# Patient Record
Sex: Female | Born: 2000 | Race: Black or African American | Hispanic: No | Marital: Single | Smoking: Never smoker
Health system: Southern US, Community
[De-identification: ages and names within clinical notes are randomized; demographics above are authoritative.]

---
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
## 2020-02-02 ENCOUNTER — Ambulatory Visit: Payer: 59 | Admitting: Nurse Practitioner

## 2020-02-02 ENCOUNTER — Other Ambulatory Visit: Payer: Self-pay

## 2020-02-02 ENCOUNTER — Encounter: Payer: Self-pay | Admitting: Nurse Practitioner

## 2020-02-02 VITALS — BP 110/68 | HR 64 | Temp 98.6°F | Ht 64.6 in | Wt 121.0 lb

## 2020-02-02 DIAGNOSIS — Z308 Encounter for other contraceptive management: Secondary | ICD-10-CM | POA: Diagnosis not present

## 2020-02-02 DIAGNOSIS — Z0000 Encounter for general adult medical examination without abnormal findings: Secondary | ICD-10-CM | POA: Diagnosis not present

## 2020-02-02 NOTE — Progress Notes (Signed)
This visit occurred during the SARS-CoV-2 public health emergency.  Safety protocols were in place, including screening questions prior to the visit, additional usage of staff PPE, and extensive cleaning of exam room while observing appropriate contact time as indicated for disinfecting solutions.  Subjective:     Patient ID: Teresa Elliott , female    DOB: 06/18/2001 , 18 y.o.   MRN: [id number]   Chief Complaint  Patient presents with  . Establish Care    HPI  Here to establish care - had been going to [facility]. She was referred by her mother Teresa Elliott.  Graduates from High School in June from [employer].  She would like to major in Sports Medicine. She cheers for the school.    One sister - healthy.  Mother - she is unsure of her health history.  Father - she is unsure if he has any health history  LMP - 01/04/2020. She is no longer taking birth control pills months. Denies being sexually active. She was unable to keep up with the schedule she is interested in either Depo or nexplanon.       History reviewed. No pertinent past medical history.   Family History  Problem Relation Age of Onset  . Healthy Mother   . Healthy Father   . Healthy Sister     No current outpatient medications on file.   No Known Allergies   Review of Systems  Constitutional: Negative.   HENT: Negative.   Eyes: Negative.   Respiratory: Negative.  Negative for cough and shortness of breath.   Cardiovascular: Negative.   Gastrointestinal: Negative.   Endocrine: Negative.   Genitourinary: Negative.   Musculoskeletal: Negative.   Skin: Negative.   Allergic/Immunologic: Negative.   Neurological: Negative.   Hematological: Negative.   Psychiatric/Behavioral: Negative.      Today's Vitals   02/02/20 1521  BP: 110/68  Pulse: 64  Temp: 98.6 F (37 C)  TempSrc: Oral  Weight: 121 lb (54.9 kg)  Height: 5' 4.6" (1.641 m)  PainSc: 0-No pain   Body mass index is 20.39 kg/m.    Objective:  Physical Exam Constitutional:      Appearance: Normal appearance. She is well-developed.  HENT:     Head: Normocephalic and atraumatic.     Right Ear: Hearing, tympanic membrane, ear canal and external ear normal. There is no impacted cerumen.     Left Ear: Hearing, tympanic membrane, ear canal and external ear normal. There is no impacted cerumen.     Nose: Nose normal. No congestion.     Mouth/Throat:     Mouth: Mucous membranes are moist.  Eyes:     General: Lids are normal.     Extraocular Movements: Extraocular movements intact.     Conjunctiva/sclera: Conjunctivae normal.     Pupils: Pupils are equal, round, and reactive to light.     Funduscopic exam:    Right eye: No papilledema.        Left eye: No papilledema.  Neck:     Thyroid: No thyroid mass.     Vascular: No carotid bruit.  Cardiovascular:     Rate and Rhythm: Normal rate and regular rhythm.     Pulses: Normal pulses.     Heart sounds: Normal heart sounds. No murmur.  Pulmonary:     Effort: Pulmonary effort is normal.     Breath sounds: Normal breath sounds.  Abdominal:     General: Abdomen is flat. Bowel sounds are normal.       Palpations: Abdomen is soft.  Musculoskeletal:        General: No swelling. Normal range of motion.     Cervical back: Full passive range of motion without pain, normal range of motion and neck supple.     Right lower leg: No edema.     Left lower leg: No edema.  Skin:    General: Skin is warm and dry.     Capillary Refill: Capillary refill takes less than 2 seconds.  Neurological:     General: No focal deficit present.     Mental Status: She is alert and oriented to person, place, and time.     Cranial Nerves: No cranial nerve deficit.     Sensory: No sensory deficit.  Psychiatric:        Mood and Affect: Mood normal.        Behavior: Behavior normal.        Thought Content: Thought content normal.        Judgment: Judgment normal.         Assessment And  Plan:     1. Health maintenance examination . Behavior modifications discussed and diet history reviewed.   . Pt will continue to exercise regularly and modify diet with low GI, plant based foods and decrease intake of processed foods.  . Recommend intake of daily multivitamin, Vitamin D, and calcium.  . Recommend for preventive screenings, as well as recommend immunizations that include influenza, TDAP - CMP14+EGFR - Lipid panel  2. Encounter for other contraceptive management  She is interested in either Nexplanon or Depo, she would like to think about this and get back with me, if she is interested in Nexplanon she will need to be referred to Gyn.    Teresa Moore, FNP    THE PATIENT IS ENCOURAGED TO PRACTICE SOCIAL DISTANCING DUE TO THE COVID-19 PANDEMIC.   

## 2020-02-15 NOTE — Patient Instructions (Signed)
Health Maintenance, Female Adopting a healthy lifestyle and getting preventive care are important in promoting health and wellness. Ask your health care provider about:  The right schedule for you to have regular tests and exams.  Things you can do on your own to prevent diseases and keep yourself healthy. What should I know about diet, weight, and exercise? Eat a healthy diet   Eat a diet that includes plenty of vegetables, fruits, low-fat dairy products, and lean protein.  Do not eat a lot of foods that are high in solid fats, added sugars, or sodium. Maintain a healthy weight Body mass index (BMI) is used to identify weight problems. It estimates body fat based on height and weight. Your health care provider can help determine your BMI and help you achieve or maintain a healthy weight. Get regular exercise Get regular exercise. This is one of the most important things you can do for your health. Most adults should:  Exercise for at least 150 minutes each week. The exercise should increase your heart rate and make you sweat (moderate-intensity exercise).  Do strengthening exercises at least twice a week. This is in addition to the moderate-intensity exercise.  Spend less time sitting. Even light physical activity can be beneficial. Watch cholesterol and blood lipids Have your blood tested for lipids and cholesterol at 20 years of age, then have this test every 5 years. Have your cholesterol levels checked more often if:  Your lipid or cholesterol levels are high.  You are older than 19 years of age.  You are at high risk for heart disease. What should I know about cancer screening? Depending on your health history and family history, you may need to have cancer screening at various ages. This may include screening for:  Breast cancer.  Cervical cancer.  Colorectal cancer.  Skin cancer.  Lung cancer. What should I know about heart disease, diabetes, and high blood  pressure? Blood pressure and heart disease  High blood pressure causes heart disease and increases the risk of stroke. This is more likely to develop in people who have high blood pressure readings, are of African descent, or are overweight.  Have your blood pressure checked: ? Every 3-5 years if you are 18-39 years of age. ? Every year if you are 40 years old or older. Diabetes Have regular diabetes screenings. This checks your fasting blood sugar level. Have the screening done:  Once every three years after age 40 if you are at a normal weight and have a low risk for diabetes.  More often and at a younger age if you are overweight or have a high risk for diabetes. What should I know about preventing infection? Hepatitis B If you have a higher risk for hepatitis B, you should be screened for this virus. Talk with your health care provider to find out if you are at risk for hepatitis B infection. Hepatitis C Testing is recommended for:  Everyone born from 1945 through 1965.  Anyone with known risk factors for hepatitis C. Sexually transmitted infections (STIs)  Get screened for STIs, including gonorrhea and chlamydia, if: ? You are sexually active and are younger than 19 years of age. ? You are older than 19 years of age and your health care provider tells you that you are at risk for this type of infection. ? Your sexual activity has changed since you were last screened, and you are at increased risk for chlamydia or gonorrhea. Ask your health care provider if   you are at risk.  Ask your health care provider about whether you are at high risk for HIV. Your health care provider may recommend a prescription medicine to help prevent HIV infection. If you choose to take medicine to prevent HIV, you should first get tested for HIV. You should then be tested every 3 months for as long as you are taking the medicine. Pregnancy  If you are about to stop having your period (premenopausal) and  you may become pregnant, seek counseling before you get pregnant.  Take 400 to 800 micrograms (mcg) of folic acid every day if you become pregnant.  Ask for birth control (contraception) if you want to prevent pregnancy. Osteoporosis and menopause Osteoporosis is a disease in which the bones lose minerals and strength with aging. This can result in bone fractures. If you are 65 years old or older, or if you are at risk for osteoporosis and fractures, ask your health care provider if you should:  Be screened for bone loss.  Take a calcium or vitamin D supplement to lower your risk of fractures.  Be given hormone replacement therapy (HRT) to treat symptoms of menopause. Follow these instructions at home: Lifestyle  Do not use any products that contain nicotine or tobacco, such as cigarettes, e-cigarettes, and chewing tobacco. If you need help quitting, ask your health care provider.  Do not use street drugs.  Do not share needles.  Ask your health care provider for help if you need support or information about quitting drugs. Alcohol use  Do not drink alcohol if: ? Your health care provider tells you not to drink. ? You are pregnant, may be pregnant, or are planning to become pregnant.  If you drink alcohol: ? Limit how much you use to 0-1 drink a day. ? Limit intake if you are breastfeeding.  Be aware of how much alcohol is in your drink. In the [place] one drink equals one 12 oz bottle of beer (355 mL), one 5 oz glass of wine (148 mL), or one 1 oz glass of hard liquor (44 mL). General instructions  Schedule regular health, dental, and eye exams.  Stay current with your vaccines.  Tell your health care provider if: ? You often feel depressed. ? You have ever been abused or do not feel safe at home. Summary  Adopting a healthy lifestyle and getting preventive care are important in promoting health and wellness.  Follow your health care provider's instructions about healthy  diet, exercising, and getting tested or screened for diseases.  Follow your health care provider's instructions on monitoring your cholesterol and blood pressure. This information is not intended to replace advice given to you by your health care provider. Make sure you discuss any questions you have with your health care provider. Document Revised: 10/29/2018 Document Reviewed: 10/29/2018 Elsevier Patient Education  2020 [employer]  

## 2020-02-18 ENCOUNTER — Telehealth: Payer: Self-pay

## 2020-02-18 NOTE — Telephone Encounter (Signed)
I called patient to schedule her a lab visit because she did not stop by the lab at her last visit. I left pt v/m to call the office. YL,RMA

## 2020-05-09 ENCOUNTER — Ambulatory Visit: Payer: 59 | Admitting: Nurse Practitioner
